# Patient Record
Sex: Female | Born: 2000 | Race: Black or African American | Hispanic: No | Marital: Single | Smoking: Never smoker
Health system: Southern US, Community
[De-identification: ages and names within clinical notes are randomized; demographics above are authoritative.]

---
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
## 2022-09-23 ENCOUNTER — Ambulatory Visit: Admission: EM | Admit: 2022-09-23 | Discharge: 2022-09-23 | Discharge status: Absent without leave | Payer: Self-pay

## 2024-06-26 ENCOUNTER — Ambulatory Visit (INDEPENDENT_AMBULATORY_CARE_PROVIDER_SITE_OTHER): Payer: Self-pay | Admitting: Family Medicine

## 2024-06-26 ENCOUNTER — Other Ambulatory Visit (HOSPITAL_COMMUNITY)
Admission: RE | Admit: 2024-06-26 | Discharge: 2024-06-26 | Disposition: A | Discharge status: Home / Self Care | Source: Ambulatory Visit | Attending: Family Medicine | Admitting: Family Medicine

## 2024-06-26 ENCOUNTER — Encounter (HOSPITAL_BASED_OUTPATIENT_CLINIC_OR_DEPARTMENT_OTHER): Payer: Self-pay | Admitting: Family Medicine

## 2024-06-26 ENCOUNTER — Ambulatory Visit (HOSPITAL_BASED_OUTPATIENT_CLINIC_OR_DEPARTMENT_OTHER): Payer: Self-pay | Admitting: Family Medicine

## 2024-06-26 ENCOUNTER — Other Ambulatory Visit (HOSPITAL_BASED_OUTPATIENT_CLINIC_OR_DEPARTMENT_OTHER): Payer: Self-pay

## 2024-06-26 VITALS — BP 124/92 | HR 85 | Ht 62.0 in | Wt 226.7 lb

## 2024-06-26 DIAGNOSIS — G43719 Chronic migraine without aura, intractable, without status migrainosus: Secondary | ICD-10-CM | POA: Diagnosis not present

## 2024-06-26 DIAGNOSIS — Z202 Contact with and (suspected) exposure to infections with a predominantly sexual mode of transmission: Secondary | ICD-10-CM | POA: Diagnosis not present

## 2024-06-26 LAB — URINE CYTOLOGY ANCILLARY ONLY
Chlamydia: NEGATIVE
Comment: NEGATIVE
Comment: NEGATIVE
Comment: NORMAL
Neisseria Gonorrhea: NEGATIVE
Trichomonas: NEGATIVE

## 2024-06-26 MED ORDER — NURTEC 75 MG PO TBDP
1.0000 | ORAL_TABLET | ORAL | 5 refills | Status: DC
  Filled 2024-06-26: qty 16, 32d supply, fill #0

## 2024-06-26 NOTE — Patient Instructions (Signed)
 Please wait for the prior authorization to be completed by the pharmacy.

## 2024-06-26 NOTE — Progress Notes (Signed)
 New Patient Office Visit  Subjective:   Catherine Jennings 12-26-2000 06/26/2024  Chief Complaint  Patient presents with   New Patient (Initial Visit)    Patient is here today to get established with the practice. States she does have problems with headaches and was also wanting to have STD testing done.    HPI: Catherine Jennings presents today to establish care at [facility] at [facility]. Introduced to Publishing rights manager role and practice setting.  All questions answered.   Last PCP: None Last annual physical: N/a  Concerns: See below   HEADACHES:  Patient reports family hx of migraines and has headaches since childhood. She states her triggers are loud noises and bright lights. She states at times she does have mild dizziness. She does use Tylenol without relief and will tough it out. Denies confusion, nausea, vomiting. Denies symptoms of aura, fever. She states she does not miss work due to headaches. She states she does have a headache more than 15 days a month. Describes headache as sharp and radiates to frontal head.   STD SCREENING: SHERIL HAMMOND presents for STD screening.   Sexual activity:  Sexually active in the past 3 months; 2 partners  Contraception: no Recent unprotected intercourse: yes Recent known exposure to STD's: no History of sexually transmitted diseases: no  Genital lesions: no Genital discharge: no Dysuria: no Swollen lymph nodes: no Fevers: no Rash: no  Patient's last menstrual period was 06/16/2024 (exact date).    The following portions of the patient's history were reviewed and updated as appropriate: past medical history, past surgical history, family history, social history, allergies, medications, and problem list.   There are no active problems to display for this patient.  History reviewed. No pertinent past medical history. History reviewed. No pertinent surgical history. Family History   Problem Relation Age of Onset   Lupus Mother    Cervical cancer Mother    Hypertension Maternal Uncle    Social History   Socioeconomic History   Marital status: Single    Spouse name: Not on file   Number of children: Not on file   Years of education: Not on file   Highest education level: Not on file  Occupational History   Not on file  Tobacco Use   Smoking status: Never   Smokeless tobacco: Not on file  Substance and Sexual Activity   Alcohol use: Yes    Comment: occasionally   Drug use: No   Sexual activity: Yes    Birth control/protection: None  Other Topics Concern   Not on file  Social History Narrative   Not on file   Social Drivers of Health   Financial Resource Strain: Not on file  Food Insecurity: Not on file  Transportation Needs: Not on file  Physical Activity: Not on file  Stress: Not on file  Social Connections: Not on file  Intimate Partner Violence: Not on file   No outpatient medications prior to visit.   No facility-administered medications prior to visit.   No Known Allergies  ROS: A complete ROS was performed with pertinent positives/negatives noted in the HPI. The remainder of the ROS are negative.   Objective:   Today's Vitals   06/26/24 1034 06/26/24 1136  BP: (!) 132/98 (!) 124/92  Pulse: 85   SpO2: 100%   Weight: 226 lb 11.2 oz (102.8 kg)   Height: 5' 2 (1.575 m)     GENERAL: Well-appearing,  in NAD. Well nourished.  SKIN: Pink, warm and dry. No rash, lesion, ulceration, or ecchymoses.  Head: Normocephalic. NECK: Trachea midline. Full ROM w/o pain or tenderness. No lymphadenopathy.  EARS: Tympanic membranes are intact, translucent without bulging and without drainage. Appropriate landmarks visualized.  EYES: Conjunctiva clear without exudates. EOMI, PERRL, no drainage present.  NOSE: Septum midline w/o deformity. Nares patent, mucosa pink and non-inflamed w/o drainage. No sinus tenderness.  THROAT: Uvula midline. Oropharynx  clear. Mucous membranes pink and moist.  RESPIRATORY: Chest wall symmetrical. Respirations even and non-labored. Breath sounds clear to auscultation bilaterally.  CARDIAC: S1, S2 present, regular rate and rhythm without murmur or gallops. Peripheral pulses 2+ bilaterally.  MSK: Muscle tone and strength appropriate for age.  NEUROLOGIC: No motor or sensory deficits. Steady, even gait. C2-C12 intact.  PSYCH/MENTAL STATUS: Alert, oriented x 3. Cooperative, appropriate mood and affect.    Health Maintenance Due  Topic Date Due   CHLAMYDIA SCREENING  Never done   HPV VACCINES (1 - 3-dose series) Never done   HIV Screening  Never done   Meningococcal B Vaccine (1 of 2 - Standard) Never done   Hepatitis C Screening  Never done   DTaP/Tdap/Td (1 - Tdap) Never done   Hepatitis B Vaccines (1 of 3 - 19+ 3-dose series) Never done   Cervical Cancer Screening (Pap smear)  Never done   COVID-19 Vaccine (1 - 2024-25 season) Never done    No results found for any visits on 06/26/24.     Assessment & Plan:  1. Encounter for assessment of sexually transmitted disease exposure (Primary) Will obtain labs and urine cytology for STI. No symptoms at this time.  - RPR - Urine cytology ancillary only - Hepatitis C antibody - HIV Antibody (routine testing w rflx)  2. Intractable chronic migraine without aura and without status migrainosus Uncontrolled. Will start Nurtec 75 mg every other day due to need to function with work and headache days greater than 15 days per month.  - Rimegepant Sulfate  (NURTEC) 75 MG TBDP; Take 1 tablet (75 mg total) by mouth every other day.  Dispense: 30 tablet; Refill: 5   Patient to reach out to office if new, worrisome, or unresolved symptoms arise or if no improvement in patient's condition. Patient verbalized understanding and is agreeable to treatment plan. All questions answered to patient's satisfaction.    Return in about 2 months (around 08/27/2024) for ANNUAL  PHYSICAL, Pap Smear.    Thersia Schuyler Stark, [place]

## 2024-06-26 NOTE — Progress Notes (Signed)
 Hi Tyona,  Your vaginal swab is negative for STD.

## 2024-06-27 ENCOUNTER — Encounter (HOSPITAL_BASED_OUTPATIENT_CLINIC_OR_DEPARTMENT_OTHER): Payer: Self-pay | Admitting: Family Medicine

## 2024-06-27 LAB — HIV ANTIBODY (ROUTINE TESTING W REFLEX): HIV Screen 4th Generation wRfx: NONREACTIVE

## 2024-06-27 LAB — RPR: RPR Ser Ql: NONREACTIVE

## 2024-06-27 LAB — HEPATITIS C ANTIBODY: Hep C Virus Ab: NONREACTIVE

## 2024-06-27 NOTE — Progress Notes (Signed)
 Catherine Jennings,  Your RPR, HIV and hep c testing is negative.

## 2024-06-28 ENCOUNTER — Other Ambulatory Visit (HOSPITAL_BASED_OUTPATIENT_CLINIC_OR_DEPARTMENT_OTHER): Payer: Self-pay | Admitting: Family Medicine

## 2024-06-28 ENCOUNTER — Other Ambulatory Visit (HOSPITAL_BASED_OUTPATIENT_CLINIC_OR_DEPARTMENT_OTHER): Payer: Self-pay

## 2024-06-28 MED ORDER — SUMATRIPTAN SUCCINATE 50 MG PO TABS
50.0000 mg | ORAL_TABLET | ORAL | 3 refills | Status: AC | PRN
Start: 2024-06-28 — End: ?
  Filled 2024-06-28: qty 10, 28d supply, fill #0

## 2024-06-29 ENCOUNTER — Other Ambulatory Visit (HOSPITAL_BASED_OUTPATIENT_CLINIC_OR_DEPARTMENT_OTHER): Payer: Self-pay

## 2024-09-30 ENCOUNTER — Encounter (HOSPITAL_BASED_OUTPATIENT_CLINIC_OR_DEPARTMENT_OTHER): Payer: Self-pay | Admitting: Family Medicine

## 2024-09-30 ENCOUNTER — Ambulatory Visit (INDEPENDENT_AMBULATORY_CARE_PROVIDER_SITE_OTHER): Admitting: Family Medicine

## 2024-09-30 ENCOUNTER — Other Ambulatory Visit (HOSPITAL_COMMUNITY)
Admission: RE | Admit: 2024-09-30 | Discharge: 2024-09-30 | Disposition: A | Source: Ambulatory Visit | Attending: Family Medicine | Admitting: Family Medicine

## 2024-09-30 VITALS — BP 128/74 | HR 100 | Ht 62.0 in | Wt 232.0 lb

## 2024-09-30 DIAGNOSIS — Z23 Encounter for immunization: Secondary | ICD-10-CM

## 2024-09-30 DIAGNOSIS — Z0000 Encounter for general adult medical examination without abnormal findings: Secondary | ICD-10-CM

## 2024-09-30 DIAGNOSIS — Z3009 Encounter for other general counseling and advice on contraception: Secondary | ICD-10-CM

## 2024-09-30 DIAGNOSIS — Z124 Encounter for screening for malignant neoplasm of cervix: Secondary | ICD-10-CM | POA: Insufficient documentation

## 2024-09-30 NOTE — Progress Notes (Signed)
 Subjective:   Catherine Jennings Jun 26, 2001  09/30/2024   CC: Chief Complaint  Patient presents with   Annual Exam    Patient is here today for her physical with pap.    HPI: Catherine Jennings is a 23 y.o. female who presents for a routine health maintenance exam.  Labs collected at time of visit. She is interested in IUD or Nexplanon contraceptive counseling and referral to OBGYN placed.    HEALTH SCREENINGS: - Vision Screening: not applicable - Dental Visits: up to date - Pap smear: pap done - Breast Exam: Declined - STD Screening: Declined - Mammogram (40+): Not applicable  - Colonoscopy (45+): Not applicable  - Bone Density (65+ or under 65 with predisposing conditions): Not applicable  - Lung CA screening with low-dose CT:  Not applicable Adults age 47-80 who are current cigarette smokers or quit within the last 15 years. Must have 20 pack year history.   Depression and Anxiety Screen done today and results listed below:     09/30/2024   10:11 AM 06/26/2024   10:39 AM  Depression screen PHQ 2/9  Decreased Interest 0 0  Down, Depressed, Hopeless 0 3  PHQ - 2 Score 0 3  Altered sleeping 2 2  Tired, decreased energy 0 0  Change in appetite 1 1  Feeling bad or failure about yourself  0 0  Trouble concentrating 0 0  Moving slowly or fidgety/restless 0 0  Suicidal thoughts 0 1  PHQ-9 Score 3 7  Difficult doing work/chores Not difficult at all Not difficult at all      09/30/2024   10:11 AM 06/26/2024   10:40 AM  GAD 7 : Generalized Anxiety Score  Nervous, Anxious, on Edge 1 1  Control/stop worrying 1 2  Worry too much - different things 1 1  Trouble relaxing 1 1  Restless 0 3  Easily annoyed or irritable 1 2  Afraid - awful might happen 0 0  Total GAD 7 Score 5 10  Anxiety Difficulty Not difficult at all Not difficult at all    IMMUNIZATIONS: - Tdap: Tetanus vaccination status reviewed: Updated today  - HPV: Up to date - Influenza: Administered today -  Pneumovax: Not applicable - Prevnar 20: Not applicable - Shingrix (50+): Not applicable   Past medical history, surgical history, medications, allergies, family history and social history reviewed with patient today and changes made to appropriate areas of the chart.   History reviewed. No pertinent past medical history.  History reviewed. No pertinent surgical history.  Current Outpatient Medications on File Prior to Visit  Medication Sig   SUMAtriptan  (IMITREX ) 50 MG tablet Take 1 tablet (50 mg total) by mouth every 2 (two) hours as needed for migraine. May repeat in 2 hours if headache persists or recurs. Maximum dosage in 24 hours is 200mg .   No current facility-administered medications on file prior to visit.    No Known Allergies   Social History   Socioeconomic History   Marital status: Single    Spouse name: Not on file   Number of children: Not on file   Years of education: Not on file   Highest education level: Not on file  Occupational History   Not on file  Tobacco Use   Smoking status: Never   Smokeless tobacco: Not on file  Substance and Sexual Activity   Alcohol use: Yes    Comment: occasionally   Drug use: No   Sexual activity: Yes  Birth control/protection: None  Other Topics Concern   Not on file  Social History Narrative   Not on file   Social Drivers of Health   Financial Resource Strain: Not on file  Food Insecurity: Not on file  Transportation Needs: Not on file  Physical Activity: Not on file  Stress: Not on file  Social Connections: Not on file  Intimate Partner Violence: Not on file   Social History   Tobacco Use  Smoking Status Never  Smokeless Tobacco Not on file   Social History   Substance and Sexual Activity  Alcohol Use Yes   Comment: occasionally    Family History  Problem Relation Age of Onset   Lupus Mother    Cervical cancer Mother    Hypertension Maternal Uncle      ROS: Denies fever, fatigue,  unexplained weight loss/gain, chest pain, SHOB, and palpitations. Denies neurological deficits, gastrointestinal or genitourinary complaints, and skin changes.   Objective:   Today's Vitals   09/30/24 1008  BP: (!) 130/91  Pulse: 100  SpO2: 100%  Weight: 232 lb (105.2 kg)  Height: 5' 2 (1.575 m)    GENERAL APPEARANCE: Well-appearing, in NAD. Well nourished.  SKIN: Pink, warm and dry. Turgor normal. No rash, lesion, ulceration, or ecchymoses. Hair evenly distributed.  HEENT: HEAD: Normocephalic.  EYES: PERRLA. EOMI. Lids intact w/o defect. Sclera white, Conjunctiva pink w/o exudate.  EARS: External ear w/o redness, swelling, masses or lesions. EAC clear. TM's intact, translucent w/o bulging, appropriate landmarks visualized. Appropriate acuity to conversational tones.  NOSE: Septum midline w/o deformity. Nares patent, mucosa pink and non-inflamed w/o drainage. No sinus tenderness.  THROAT: Uvula midline. Oropharynx clear. Tonsils non-inflamed w/o exudate. Oral mucosa pink and moist.  NECK: Supple, Trachea midline. Full ROM w/o pain or tenderness. No lymphadenopathy. Thyroid non-tender w/o enlargement or palpable masses.  RESPIRATORY: Chest wall symmetrical w/o masses. Respirations even and non-labored. Breath sounds clear to auscultation bilaterally. No wheezes, rales, rhonchi, or crackles. CARDIAC: S1, S2 present, regular rate and rhythm. No gallops, murmurs, rubs, or clicks. PMI w/o lifts, heaves, or thrills. No carotid bruits. Capillary refill <2 seconds. Peripheral pulses 2+ bilaterally. GI: Abdomen soft w/o distention. Normoactive bowel sounds. No palpable masses or tenderness. No guarding or rebound tenderness. Liver and spleen w/o tenderness or enlargement. No CVA tenderness.  GU: External genitalia without erythema, lesions, or masses. No lymphadenopathy. Vaginal mucosa pink and moist without exudate, lesions, or ulcerations. Cervix pink without discharge. Cervical os closed. Uterus  and adnexae palpable, not enlarged, and w/o tenderness. No palpable masses.  MSK: Muscle tone and strength appropriate for age, w/o atrophy or abnormal movement.  EXTREMITIES: Active ROM intact, w/o tenderness, crepitus, or contracture. No obvious joint deformities or effusions. No clubbing, edema, or cyanosis.  NEUROLOGIC: CN's II-XII intact. Motor strength symmetrical with no obvious weakness. No sensory deficits. DTR's 2+ symmetric bilaterally. Steady, even gait.  PSYCH/MENTAL STATUS: Alert, oriented x 3. Cooperative, appropriate mood and affect.   Chaperoned by Rosina Ada, DNP Student RN, BSN    Assessment & Plan:  1. Annual physical exam (Primary) Discussed preventative screenings, vaccines, and healthy lifestyle with patient. Fasting labs collected as part of AE today.  - Cytology - PAP - CBC with Differential/Platelet - Comprehensive metabolic panel with GFR - Lipid panel - TSH  2. Encounter for general counseling and advice on contraceptive management Referral placed to OBGYN as patient is interested in IUD or Nexplanon placement for contraceptive management.  - Ambulatory referral to  Obstetrics / Gynecology  3. Encounter for Papanicolaou smear for cervical cancer screening - Cytology - PAP  4. Immunization due VIS provided in AVS.  - Flu vaccine trivalent PF, 6mos and older(Flulaval,Afluria,Fluarix,Fluzone) - Tdap vaccine greater than or equal to 7yo IM   Orders Placed This Encounter  Procedures   Flu vaccine trivalent PF, 6mos and older(Flulaval,Afluria,Fluarix,Fluzone)   Tdap vaccine greater than or equal to 7yo IM   CBC with Differential/Platelet   Comprehensive metabolic panel with GFR   Lipid panel   TSH   Ambulatory referral to Obstetrics / Gynecology    Referral Priority:   Routine    Referral Type:   Consultation    Referral Reason:   Specialty Services Required    Requested Specialty:   Obstetrics and Gynecology    Number of Visits Requested:   1     PATIENT COUNSELING:  - Encouraged a healthy well-balanced diet. Patient may adjust caloric intake to maintain or achieve ideal body weight. May reduce intake of dietary saturated fat and total fat and have adequate dietary potassium and calcium preferably from fresh fruits, vegetables, and low-fat dairy products.   - Advised to avoid cigarette smoking. - Discussed with the patient that most people either abstain from alcohol or drink within safe limits (<=14/week and <=4 drinks/occasion for males, <=7/weeks and <= 3 drinks/occasion for females) and that the risk for alcohol disorders and other health effects rises proportionally with the number of drinks per week and how often a drinker exceeds daily limits. - Discussed cessation/primary prevention of drug use and availability of treatment for abuse.  - Discussed sexually transmitted diseases, avoidance of unintended pregnancy and contraceptive alternatives.  - Stressed the importance of regular exercise - Injury prevention: Discussed safety belts, safety helmets, smoke detector, smoking near bedding or upholstery.  - Dental health: Discussed importance of regular tooth brushing, flossing, and dental visits.   NEXT PREVENTATIVE PHYSICAL DUE IN 1 YEAR.  Return in about 1 year (around 09/30/2025) for ANNUAL PHYSICAL.  Patient to reach out to office if new, worrisome, or unresolved symptoms arise or if no improvement in patient's condition. Patient verbalized understanding and is agreeable to treatment plan. All questions answered to patient's satisfaction.    Thersia Schuyler Stark, [place]

## 2024-09-30 NOTE — Patient Instructions (Signed)

## 2024-10-01 ENCOUNTER — Ambulatory Visit (HOSPITAL_BASED_OUTPATIENT_CLINIC_OR_DEPARTMENT_OTHER): Payer: Self-pay | Admitting: Family Medicine

## 2024-10-01 LAB — CBC WITH DIFFERENTIAL/PLATELET
Basophils Absolute: 0 x10E3/uL (ref 0.0–0.2)
Basos: 0 %
EOS (ABSOLUTE): 0.1 x10E3/uL (ref 0.0–0.4)
Eos: 1 %
Hematocrit: 41.1 % (ref 34.0–46.6)
Hemoglobin: 12.3 g/dL (ref 11.1–15.9)
Immature Grans (Abs): 0 x10E3/uL (ref 0.0–0.1)
Immature Granulocytes: 0 %
Lymphocytes Absolute: 3.1 x10E3/uL (ref 0.7–3.1)
Lymphs: 34 %
MCH: 24 pg — ABNORMAL LOW (ref 26.6–33.0)
MCHC: 29.9 g/dL — ABNORMAL LOW (ref 31.5–35.7)
MCV: 80 fL (ref 79–97)
Monocytes Absolute: 0.7 x10E3/uL (ref 0.1–0.9)
Monocytes: 8 %
Neutrophils Absolute: 5 x10E3/uL (ref 1.4–7.0)
Neutrophils: 57 %
Platelets: 389 x10E3/uL (ref 150–450)
RBC: 5.12 x10E6/uL (ref 3.77–5.28)
RDW: 13.8 % (ref 11.7–15.4)
WBC: 9 x10E3/uL (ref 3.4–10.8)

## 2024-10-01 LAB — LIPID PANEL
Chol/HDL Ratio: 4.1 ratio (ref 0.0–4.4)
Cholesterol, Total: 200 mg/dL — ABNORMAL HIGH (ref 100–199)
HDL: 49 mg/dL (ref >39)
LDL Chol Calc (NIH): 144 mg/dL — ABNORMAL HIGH (ref 0–99)
Triglycerides: 36 mg/dL (ref 0–149)
VLDL Cholesterol Cal: 7 mg/dL (ref 5–40)

## 2024-10-01 LAB — COMPREHENSIVE METABOLIC PANEL WITH GFR
ALT: 17 IU/L (ref 0–32)
AST: 20 IU/L (ref 0–40)
Albumin: 4.1 g/dL (ref 4.0–5.0)
Alkaline Phosphatase: 71 IU/L (ref 41–116)
BUN/Creatinine Ratio: 11 (ref 9–23)
BUN: 9 mg/dL (ref 6–20)
Bilirubin Total: 0.4 mg/dL (ref 0.0–1.2)
CO2: 18 mmol/L — ABNORMAL LOW (ref 20–29)
Calcium: 9.1 mg/dL (ref 8.7–10.2)
Chloride: 105 mmol/L (ref 96–106)
Creatinine, Ser: 0.81 mg/dL (ref 0.57–1.00)
Globulin, Total: 2.6 g/dL (ref 1.5–4.5)
Glucose: 94 mg/dL (ref 70–99)
Potassium: 4.2 mmol/L (ref 3.5–5.2)
Sodium: 139 mmol/L (ref 134–144)
Total Protein: 6.7 g/dL (ref 6.0–8.5)
eGFR: 105 mL/min/1.73 (ref >59)

## 2024-10-01 LAB — CYTOLOGY - PAP
Comment: NEGATIVE
Diagnosis: NEGATIVE
High risk HPV: NEGATIVE

## 2024-10-01 LAB — TSH: TSH: 2.86 u[IU]/mL (ref 0.450–4.500)

## 2024-10-01 NOTE — Progress Notes (Signed)
 Pap Smear is normal. Health maintenance updated. Repeat pap in 3 years.

## 2024-10-01 NOTE — Progress Notes (Signed)
 Hi Catherine Jennings,  Your thyroid function is stable.  Your cholesterol is slightly elevated.  Please continue a heart healthy diet, and regular exercise to help lower this and reduce heart disease risk.  Your electrolytes, kidney and liver function are stable.  Your blood counts did show some possible anemia present.  Have you ever had a history of iron deficiency in the past?  We could obtain a iron level to see if you are deficient in this area and possibly need supplementation.  Please let me know

## 2024-10-02 NOTE — Telephone Encounter (Signed)
 Pt saw message from PCP about results via mychart.

## 2024-10-14 ENCOUNTER — Encounter (HOSPITAL_BASED_OUTPATIENT_CLINIC_OR_DEPARTMENT_OTHER): Payer: Self-pay | Admitting: Certified Nurse Midwife

## 2024-10-14 ENCOUNTER — Ambulatory Visit (HOSPITAL_BASED_OUTPATIENT_CLINIC_OR_DEPARTMENT_OTHER): Admitting: Certified Nurse Midwife

## 2024-10-14 VITALS — BP 140/84 | HR 81 | Wt 230.6 lb

## 2024-10-14 DIAGNOSIS — Z3202 Encounter for pregnancy test, result negative: Secondary | ICD-10-CM

## 2024-10-14 DIAGNOSIS — Z30017 Encounter for initial prescription of implantable subdermal contraceptive: Secondary | ICD-10-CM | POA: Insufficient documentation

## 2024-10-14 DIAGNOSIS — Z91030 Bee allergy status: Secondary | ICD-10-CM

## 2024-10-14 LAB — POCT URINE PREGNANCY: Preg Test, Ur: NEGATIVE

## 2024-10-14 MED ORDER — EPINEPHRINE 0.3 MG/0.3ML IJ SOAJ
0.3000 mg | INTRAMUSCULAR | 1 refills | Status: AC | PRN
Start: 2024-10-14 — End: ?

## 2024-10-14 MED ORDER — ETONOGESTREL 68 MG ~~LOC~~ IMPL
68.0000 mg | DRUG_IMPLANT | Freq: Once | SUBCUTANEOUS | Status: AC
  Administered 2024-10-14: 68 mg via SUBCUTANEOUS

## 2024-10-14 NOTE — Progress Notes (Signed)
    GYNECOLOGY  VISIT  CC:   Contraception   HPI: 23 y.o. No obstetric history on file. Single Black or African American female here for insertion Nexplanon Left Arm. Pt states she is aware of all contraceptive options but has decided that she would prefer Nexplanon.  Her last sexual activity was 6 months ago. Pt prefers Nexplanon. Her last pap was 09/2024 and was Negative. UPT Negative.   Patient's last menstrual period was 10/07/2024 (exact date).  History reviewed. No pertinent past medical history.  MEDS:  Reviewed in EPIC  ALLERGIES: Bee venom  SH:  Good support system  ROS  PHYSICAL EXAMINATION:    BP (!) 140/84 (BP Location: Left Arm, Patient Position: Sitting, Cuff Size: Large)   Pulse 81   Wt 230 lb 9.6 oz (104.6 kg)   LMP 10/07/2024 (Exact Date)   SpO2 100%   BMI 42.18 kg/m     General appearance: alert, cooperative and appears stated age  Assessment/Plan: 1. Negative pregnancy test (Primary) - POCT urine pregnancy  2. History of bee sting allergy - EPINEPHrine (EPIPEN 2-PAK) 0.3 mg/0.3 mL IJ SOAJ injection; Inject 0.3 mg into the muscle as needed for anaphylaxis.  Dispense: 1 each; Refill: 1   3. Encounter for Insertion Nexplanon Contraceptive Implant.  Nexplanon Insertion Procedure Patient identified, informed consent performed, consent signed.   Patient does understand that irregular bleeding is a very common side effect of this medication. She was advised to have backup contraception for one week after placement. Pregnancy test in clinic today was negative.  Appropriate time out taken.  Patient's left arm was prepped and draped in the usual sterile fashion.. The ruler used to measure and mark insertion area.  Patient was prepped with alcohol swab and then injected with 3 ml of 1% lidocaine.  She was prepped with betadine, Nexplanon removed from packaging,  Device confirmed in needle, then inserted full length of needle and withdrawn per handbook instructions.  Nexplanon was able to palpated in the patient's arm; patient palpated the insert herself. There was minimal blood loss.  Patient insertion site covered with guaze and a pressure bandage to reduce any bruising.  The patient tolerated the procedure well and was given post procedure instructions.   She was encouraged to schedule Annual Gyn Exam. Arland MARLA Roller
